# Patient Record
Sex: Female | Born: 1995 | Hispanic: Yes | Marital: Single | Smoking: Never smoker
Health system: Southern US, Community
[De-identification: ages and names within clinical notes are randomized; demographics above are authoritative.]

---

## 2015-08-31 ENCOUNTER — Emergency Department (HOSPITAL_COMMUNITY)
Admission: EM | Admit: 2015-08-31 | Discharge: 2015-08-31 | Disposition: A | Discharge status: Home / Self Care | Payer: Medicaid Other | Attending: Emergency Medicine | Admitting: Emergency Medicine

## 2015-08-31 ENCOUNTER — Encounter (HOSPITAL_COMMUNITY): Payer: Self-pay | Admitting: *Deleted

## 2015-08-31 DIAGNOSIS — J029 Acute pharyngitis, unspecified: Secondary | ICD-10-CM | POA: Diagnosis present

## 2015-08-31 LAB — COMPREHENSIVE METABOLIC PANEL
ALBUMIN: 4.3 g/dL (ref 3.5–5.0)
ALT: 21 U/L (ref 14–54)
AST: 19 U/L (ref 15–41)
Alkaline Phosphatase: 68 U/L (ref 38–126)
Anion gap: 6 (ref 5–15)
BILIRUBIN TOTAL: 0.5 mg/dL (ref 0.3–1.2)
BUN: 9 mg/dL (ref 6–20)
CO2: 27 mmol/L (ref 22–32)
Calcium: 9.8 mg/dL (ref 8.9–10.3)
Chloride: 107 mmol/L (ref 101–111)
Creatinine, Ser: 0.67 mg/dL (ref 0.44–1.00)
GFR calc Af Amer: >60 mL/min (ref >60)
GFR calc non Af Amer: >60 mL/min (ref >60)
GLUCOSE: 74 mg/dL (ref 65–99)
POTASSIUM: 3.8 mmol/L (ref 3.5–5.1)
SODIUM: 140 mmol/L (ref 135–145)
TOTAL PROTEIN: 7.1 g/dL (ref 6.5–8.1)

## 2015-08-31 LAB — MONONUCLEOSIS SCREEN: Mono Screen: NEGATIVE

## 2015-08-31 LAB — CBC WITH DIFFERENTIAL/PLATELET
BASOS ABS: 0 10*3/uL (ref 0.0–0.1)
BASOS PCT: 0 %
EOS ABS: 0.2 10*3/uL (ref 0.0–0.7)
Eosinophils Relative: 2 %
HEMATOCRIT: 36.5 % (ref 36.0–46.0)
HEMOGLOBIN: 12.3 g/dL (ref 12.0–15.0)
Lymphocytes Relative: 22 %
Lymphs Abs: 1.5 10*3/uL (ref 0.7–4.0)
MCH: 27.2 pg (ref 26.0–34.0)
MCHC: 33.7 g/dL (ref 30.0–36.0)
MCV: 80.8 fL (ref 78.0–100.0)
MONO ABS: 0.4 10*3/uL (ref 0.1–1.0)
Monocytes Relative: 6 %
NEUTROS ABS: 4.8 10*3/uL (ref 1.7–7.7)
NEUTROS PCT: 70 %
Platelets: 208 10*3/uL (ref 150–400)
RBC: 4.52 MIL/uL (ref 3.87–5.11)
RDW: 13.5 % (ref 11.5–15.5)
WBC: 6.9 10*3/uL (ref 4.0–10.5)

## 2015-08-31 LAB — I-STAT BETA HCG BLOOD, ED (MC, WL, AP ONLY): I-stat hCG, quantitative: <5 m[IU]/mL (ref <5)

## 2015-08-31 MED ORDER — GI COCKTAIL ~~LOC~~
30.0000 mL | Freq: Once | ORAL | Status: AC
  Administered 2015-08-31: 30 mL via ORAL
  Filled 2015-08-31: qty 30

## 2015-08-31 MED ORDER — SODIUM CHLORIDE 0.9 % IV BOLUS (SEPSIS)
1000.0000 mL | Freq: Once | INTRAVENOUS | Status: AC
  Administered 2015-08-31: 1000 mL via INTRAVENOUS

## 2015-08-31 MED ORDER — KETOROLAC TROMETHAMINE 30 MG/ML IJ SOLN
30.0000 mg | Freq: Once | INTRAMUSCULAR | Status: AC
  Administered 2015-08-31: 30 mg via INTRAVENOUS
  Filled 2015-08-31: qty 1

## 2015-08-31 MED ORDER — FLUTICASONE PROPIONATE 50 MCG/ACT NA SUSP: 2.0000 | Freq: Every day | NASAL | 0 refills | Status: DC

## 2015-08-31 MED ORDER — DEXAMETHASONE SODIUM PHOSPHATE 10 MG/ML IJ SOLN
4.0000 mg | Freq: Once | INTRAMUSCULAR | Status: AC
  Administered 2015-08-31: 4 mg via INTRAVENOUS
  Filled 2015-08-31: qty 1

## 2015-08-31 NOTE — ED Provider Notes (Signed)
[facility]-EMERGENCY DEPT Provider Note   CSN: [id number] Arrival date & time: 08/31/15  0800     History   Chief Complaint Chief Complaint  Patient presents with  . Sore Throat    HPI Leah Kim is a 20 y.o. female alert healthy here presenting with sore throat. Patient states that she's been having sore throat for the last 3 weeks. She has some trouble swallowing but is able to keep food and liquids down. Patient saw an allergist about 2 weeks ago and had a negative strep and was sent home with Augmentin and prednisone which did not help much. She then follow-up with the allergist about 5 days ago and had another negative strep swab and was given some mouth rinses that did not help. Denies any fevers or chills. Patient states that she was clearing her throat this morning and then spit up some sputum that is blood-tinged. Denies any cough or abdominal pain. She is otherwise healthy.    The history is provided by the patient.    History reviewed. No pertinent past medical history.  There are no active problems to display for this patient.   History reviewed. No pertinent surgical history.  OB History    No data available       Home Medications    Prior to Admission medications   Not on File    Family History No family history on file.  Social History Social History  Substance Use Topics  . Smoking status: Never Smoker  . Smokeless tobacco: Never Used  . Alcohol use No     Allergies   Review of patient's allergies indicates no known allergies.   Review of Systems Review of Systems  HENT: Positive for sore throat.   All other systems reviewed and are negative.    Physical Exam Updated Vital Signs BP 96/78 (BP Location: Left Arm)   Pulse 80   Temp 99 F (37.2 C) (Oral)   Resp 16   Ht 5' (1.524 m)   Wt 186 lb (84.4 kg)   LMP 08/04/2015   SpO2 100%   BMI 36.33 kg/m   Physical Exam  Constitutional: She is oriented to person, place, and time. She  appears well-developed and well-nourished.  Slightly anxious   HENT:  Head: Normocephalic.  OP not red, mildly irritated but no active bleeding. Tonsils not enlarged and no exudates   Eyes: EOM are normal. Pupils are equal, round, and reactive to light.  Neck: Normal range of motion.  Mild bilateral cervical LAD   Cardiovascular: Normal rate, regular rhythm and normal heart sounds.   Pulmonary/Chest: Effort normal and breath sounds normal. No respiratory distress. She has no wheezes. She has no rales.  Abdominal: Soft. Bowel sounds are normal. She exhibits no distension. There is no tenderness. There is no guarding.  Musculoskeletal: Normal range of motion. She exhibits no edema.  Neurological: She is alert and oriented to person, place, and time.  Skin: Skin is warm.  Psychiatric: She has a normal mood and affect.  Nursing note and vitals reviewed.    ED Treatments / Results  Labs (all labs ordered are listed, but only abnormal results are displayed) Labs Reviewed  CBC WITH DIFFERENTIAL/PLATELET  COMPREHENSIVE METABOLIC PANEL  MONONUCLEOSIS SCREEN  I-STAT BETA HCG BLOOD, ED ([facility], [facility], [facility] ONLY)    EKG  EKG Interpretation None       Radiology No results found.  Procedures Procedures (including critical care time)  Medications Ordered in ED Medications  sodium chloride 0.9 % bolus 1,000 mL (0 mLs Intravenous Stopped 08/31/15 1104)  ketorolac (TORADOL) 30 MG/ML injection 30 mg (30 mg Intravenous Given 08/31/15 1006)  gi cocktail (Maalox,Lidocaine,Donnatal) (30 mLs Oral Given 08/31/15 1000)  dexamethasone (DECADRON) injection 4 mg (4 mg Intravenous Given 08/31/15 1001)     Initial Impression / Assessment and Plan / ED Course  I have reviewed the triage vital signs and the nursing notes.  Pertinent labs & imaging results that were available during my care of the patient were reviewed by me and considered in my medical decision making (see chart for details).  Clinical  Course    Leah Kim is a 20 y.o. female here with sore throat. Sore throat for 3 weeks, low grade temp in the ED. Posterior pharynx mildly irritated but no tonsillar exudates. Had 2 neg strep already over the last 3 weeks. No tick bites. I think likely mono vs allergic vs viral. Will check labs, mono. Will hydrate and reassess.   11:27 AM Labs unremarkable. Mono spot neg. Felt better with GI cocktail and decadron. Will give flonase as it may help if its allergic or viral. She request ENT follow up outpatient.    Final Clinical Impressions(s) / ED Diagnoses   Final diagnoses:  None    New Prescriptions New Prescriptions   No medications on file     Leah Panderavid Hsienta Kristianne Albin, MD 08/31/15 1129

## 2015-08-31 NOTE — ED Notes (Signed)
Pt reports a feeling of something caught on the right side of her throat for 3 weeks. Pt reports increasing pain and blood in her saliva. Pt reports no relief and the specialist she is seeing does not have the necessary equipment to treat her further. Attempted antibiotics and a gargling solution with not relief.

## 2015-08-31 NOTE — Discharge Instructions (Signed)
Use flonase twice daily bilateral nose as it may have your throat.   Continue tylenol, motrin for fever and sore throat.   See Dr. Annalee GentaShoemaker from ENT for follow up   Return to ER if you have worse sore throat, fever, throat swelling, trouble breathing or swallowing.

## 2015-08-31 NOTE — ED Triage Notes (Signed)
Patient states she feels as though something is stuck in her throat x4 weeks and has had neck pain.  Patient saw a doctor [facility] almost 2 weeks ago and was started on Augmentin and prednisone.  She has finished both courses and had f/u with them this past Tuesday.  Patient was prescribed a mouth wash for gargling.  She states pain is not getting better and she now has blood streaked saliva occasionally.  Patient denies fever and N/V/D.  Strep was negative at allergist's office both visits.  Airway patent, oropharynx mildly erythematous, tonsils apparent, uvula midline.  No external swelling noted.

## 2015-11-14 ENCOUNTER — Other Ambulatory Visit (HOSPITAL_COMMUNITY): Payer: Self-pay | Admitting: Otolaryngology

## 2015-11-14 DIAGNOSIS — R1313 Dysphagia, pharyngeal phase: Secondary | ICD-10-CM

## 2015-11-14 DIAGNOSIS — K219 Gastro-esophageal reflux disease without esophagitis: Secondary | ICD-10-CM

## 2015-11-14 DIAGNOSIS — R0989 Other specified symptoms and signs involving the circulatory and respiratory systems: Secondary | ICD-10-CM

## 2015-11-20 ENCOUNTER — Encounter (HOSPITAL_COMMUNITY): Payer: Self-pay | Admitting: Radiology

## 2015-11-20 ENCOUNTER — Ambulatory Visit (HOSPITAL_COMMUNITY)
Admission: RE | Admit: 2015-11-20 | Discharge: 2015-11-20 | Disposition: A | Discharge status: Home / Self Care | Payer: Medicaid Other | Source: Ambulatory Visit | Attending: Otolaryngology | Admitting: Otolaryngology

## 2015-11-20 DIAGNOSIS — R1313 Dysphagia, pharyngeal phase: Secondary | ICD-10-CM

## 2015-11-20 DIAGNOSIS — K219 Gastro-esophageal reflux disease without esophagitis: Secondary | ICD-10-CM | POA: Insufficient documentation

## 2015-11-20 DIAGNOSIS — R0989 Other specified symptoms and signs involving the circulatory and respiratory systems: Secondary | ICD-10-CM

## 2015-11-21 ENCOUNTER — Ambulatory Visit (HOSPITAL_COMMUNITY): Payer: Medicaid Other

## 2016-05-06 ENCOUNTER — Emergency Department (HOSPITAL_COMMUNITY)
Admission: EM | Admit: 2016-05-06 | Discharge: 2016-05-06 | Disposition: A | Discharge status: Home / Self Care | Payer: Medicaid Other | Attending: Physician Assistant | Admitting: Physician Assistant

## 2016-05-06 ENCOUNTER — Emergency Department (HOSPITAL_COMMUNITY): Payer: Medicaid Other

## 2016-05-06 ENCOUNTER — Encounter (HOSPITAL_COMMUNITY): Payer: Self-pay | Admitting: *Deleted

## 2016-05-06 DIAGNOSIS — R072 Precordial pain: Secondary | ICD-10-CM | POA: Insufficient documentation

## 2016-05-06 DIAGNOSIS — R071 Chest pain on breathing: Secondary | ICD-10-CM | POA: Diagnosis present

## 2016-05-06 DIAGNOSIS — R0602 Shortness of breath: Secondary | ICD-10-CM | POA: Insufficient documentation

## 2016-05-06 LAB — BASIC METABOLIC PANEL
ANION GAP: 8 (ref 5–15)
BUN: 12 mg/dL (ref 6–20)
CALCIUM: 9.6 mg/dL (ref 8.9–10.3)
CO2: 26 mmol/L (ref 22–32)
CREATININE: 0.83 mg/dL (ref 0.44–1.00)
Chloride: 102 mmol/L (ref 101–111)
GFR calc Af Amer: >60 mL/min (ref >60)
GLUCOSE: 83 mg/dL (ref 65–99)
Potassium: 3.3 mmol/L — ABNORMAL LOW (ref 3.5–5.1)
Sodium: 136 mmol/L (ref 135–145)

## 2016-05-06 LAB — CBC
HCT: 35.7 % — ABNORMAL LOW (ref 36.0–46.0)
HEMOGLOBIN: 12.3 g/dL (ref 12.0–15.0)
MCH: 26.6 pg (ref 26.0–34.0)
MCHC: 34.5 g/dL (ref 30.0–36.0)
MCV: 77.1 fL — ABNORMAL LOW (ref 78.0–100.0)
PLATELETS: 230 10*3/uL (ref 150–400)
RBC: 4.63 MIL/uL (ref 3.87–5.11)
RDW: 13.7 % (ref 11.5–15.5)
WBC: 9.6 10*3/uL (ref 4.0–10.5)

## 2016-05-06 LAB — D-DIMER, QUANTITATIVE: D-Dimer, Quant: <0.27 ug/mL-FEU (ref 0.00–0.50)

## 2016-05-06 LAB — I-STAT TROPONIN, ED: TROPONIN I, POC: 0 ng/mL (ref 0.00–0.08)

## 2016-05-06 NOTE — ED Provider Notes (Signed)
[facility]-EMERGENCY DEPT Provider Note   CSN: [id number] Arrival date & time: 05/06/16  1831     History   Chief Complaint Chief Complaint  Patient presents with  . Chest Pain    HPI Leah Kim is a 21 y.o. female.  The history is provided by the patient.  Chest Pain   This is a new problem. The current episode started yesterday. The problem occurs constantly (intermittent). The problem has not changed since onset.The pain is associated with rest. The pain is present in the lateral region. The pain is at a severity of 3/10. The pain is mild. The quality of the pain is described as brief, heavy, exertional and pressure-like. The pain radiates to the left arm. The symptoms are aggravated by deep breathing. Associated symptoms include shortness of breath. Pertinent negatives include no abdominal pain, no fever, no palpitations and no syncope. She has tried nothing for the symptoms. The treatment provided no relief. Risk factors: recent car trip > 8 8hours, no estrogens.  Pertinent negatives for past medical history include no CAD and no PE.  Pertinent negatives for family medical history include: no early MI.    History reviewed. No pertinent past medical history.  There are no active problems to display for this patient.   History reviewed. No pertinent surgical history.  OB History    No data available       Home Medications    Prior to Admission medications   Medication Sig Start Date End Date Taking? Authorizing Provider  fluticasone (FLONASE) 50 MCG/ACT nasal spray Place 2 sprays into both nostrils daily. 08/31/15   Charlynne Pander, MD    Family History No family history on file.  Social History Social History  Substance Use Topics  . Smoking status: Never Smoker  . Smokeless tobacco: Never Used  . Alcohol use No     Allergies   Patient has no known allergies.   Review of Systems Review of Systems  Constitutional: Negative for fatigue and fever.    Respiratory: Positive for shortness of breath.   Cardiovascular: Positive for chest pain. Negative for palpitations and syncope.  Gastrointestinal: Negative for abdominal pain.     Physical Exam Updated Vital Signs BP 138/83 (BP Location: Left Wrist)   Pulse (!) 115   Temp 98 F (36.7 C) (Oral)   Resp (!) 22   Ht 5' (1.524 m)   Wt 182 lb (82.6 kg)   LMP 04/29/2016   SpO2 100%   BMI 35.54 kg/m   Physical Exam  Constitutional: She is oriented to person, place, and time. She appears well-developed and well-nourished.  HENT:  Head: Normocephalic and atraumatic.  Eyes: Right eye exhibits no discharge.  Cardiovascular: Normal rate, regular rhythm and normal heart sounds.   No murmur heard. Pulmonary/Chest: Effort normal and breath sounds normal. She has no wheezes. She has no rales.  Abdominal: Soft. She exhibits no distension. There is no tenderness.  Neurological: She is oriented to person, place, and time.  Skin: Skin is warm and dry. She is not diaphoretic.  Psychiatric: She has a normal mood and affect.  Nursing note and vitals reviewed.    ED Treatments / Results  Labs (all labs ordered are listed, but only abnormal results are displayed) Labs Reviewed  BASIC METABOLIC PANEL - Abnormal; Notable for the following:       Result Value   Potassium 3.3 (*)    All other components within normal limits  CBC - Abnormal; Notable  for the following:    HCT 35.7 (*)    MCV 77.1 (*)    All other components within normal limits  D-DIMER, QUANTITATIVE (NOT AT [facility])  I-STAT TROPOININ, ED    EKG  EKG Interpretation  Date/Time:  Wednesday May 06 2016 18:42:33 EDT Ventricular Rate:  114 PR Interval:    QRS Duration: 93 QT Interval:  333 QTC Calculation: 459 R Axis:   41 Text Interpretation:  Sinus tachycardia Normal sinus rhythm Confirmed by Kandis Mannan ([id number]) on 05/06/2016 7:43:54 PM       Radiology Dg Chest 2 View  Result Date: 05/06/2016 CLINICAL DATA:   Left-sided chest pain EXAM: CHEST  2 VIEW COMPARISON:  None. FINDINGS: The heart size and mediastinal contours are within normal limits. Both lungs are clear. The visualized skeletal structures are unremarkable. IMPRESSION: No active cardiopulmonary disease. Electronically Signed   By: Alcide Clever M.D.   On: 05/06/2016 19:31    Procedures Procedures (including critical care time)  Medications Ordered in ED Medications - No data to display   Initial Impression / Assessment and Plan / ED Course  I have reviewed the triage vital signs and the nursing notes.  Pertinent labs & imaging results that were available during my care of the patient were reviewed by me and considered in my medical decision making (see chart for details).    Pt is a 21 yo here with L sided shoulder/chest pain associated occasionally SOB. Intermittent since yseterday, wosre with breath. Recent long car trip. Will d iimer, chest xray labs.  EKG non ischemic.    Final Clinical Impressions(s) / ED Diagnoses   Final diagnoses:  None    New Prescriptions New Prescriptions   No medications on file     Lealand Elting Randall An, MD 05/06/16 1950

## 2016-05-06 NOTE — ED Notes (Signed)
Confirmed with lab that had the blood work to run D-Dimer.

## 2016-05-06 NOTE — Discharge Instructions (Signed)
We are unsure what is causing this pain in her chest. However we are reassured by her normal chest x-ray and lab work that shows you did not likely have a blood clot. Please return to primary care physician or the emergency department if this pain worsens, he developed acutely shortness of breath or have any other concerns.

## 2016-05-06 NOTE — ED Triage Notes (Signed)
Pt complains of left sided chest pain radiating down left arm since this morning. Pain is constant. Pt had similar pain last Friday, which she states went away after 4 hours. Pt states she started feeling short of breath 1 hour prior to arrival to ED. Pt denies cardiac history.

## 2016-11-25 ENCOUNTER — Encounter (HOSPITAL_COMMUNITY): Payer: Self-pay | Admitting: *Deleted

## 2016-11-25 ENCOUNTER — Emergency Department (HOSPITAL_COMMUNITY): Payer: Medicaid Other

## 2016-11-25 ENCOUNTER — Emergency Department (HOSPITAL_COMMUNITY)
Admission: EM | Admit: 2016-11-25 | Discharge: 2016-11-25 | Disposition: A | Discharge status: Home / Self Care | Payer: Medicaid Other | Attending: Emergency Medicine | Admitting: Emergency Medicine

## 2016-11-25 DIAGNOSIS — Z79899 Other long term (current) drug therapy: Secondary | ICD-10-CM | POA: Diagnosis not present

## 2016-11-25 DIAGNOSIS — R102 Pelvic and perineal pain: Secondary | ICD-10-CM | POA: Insufficient documentation

## 2016-11-25 DIAGNOSIS — Z3A00 Weeks of gestation of pregnancy not specified: Secondary | ICD-10-CM | POA: Insufficient documentation

## 2016-11-25 DIAGNOSIS — O200 Threatened abortion: Secondary | ICD-10-CM | POA: Diagnosis not present

## 2016-11-25 DIAGNOSIS — O209 Hemorrhage in early pregnancy, unspecified: Secondary | ICD-10-CM | POA: Diagnosis present

## 2016-11-25 LAB — WET PREP, GENITAL
Clue Cells Wet Prep HPF POC: NONE SEEN
Sperm: NONE SEEN
Trich, Wet Prep: NONE SEEN
Yeast Wet Prep HPF POC: NONE SEEN

## 2016-11-25 LAB — COMPREHENSIVE METABOLIC PANEL
ALT: 15 U/L (ref 14–54)
ANION GAP: 6 (ref 5–15)
AST: 22 U/L (ref 15–41)
Albumin: 4 g/dL (ref 3.5–5.0)
Alkaline Phosphatase: 60 U/L (ref 38–126)
BILIRUBIN TOTAL: 0.9 mg/dL (ref 0.3–1.2)
BUN: 9 mg/dL (ref 6–20)
CO2: 25 mmol/L (ref 22–32)
Calcium: 9 mg/dL (ref 8.9–10.3)
Chloride: 107 mmol/L (ref 101–111)
Creatinine, Ser: 0.65 mg/dL (ref 0.44–1.00)
Glucose, Bld: 84 mg/dL (ref 65–99)
POTASSIUM: 4 mmol/L (ref 3.5–5.1)
Sodium: 138 mmol/L (ref 135–145)
TOTAL PROTEIN: 7.3 g/dL (ref 6.5–8.1)

## 2016-11-25 LAB — URINALYSIS, ROUTINE W REFLEX MICROSCOPIC
BILIRUBIN URINE: NEGATIVE
Bacteria, UA: NONE SEEN
Glucose, UA: NEGATIVE mg/dL
KETONES UR: NEGATIVE mg/dL
LEUKOCYTES UA: NEGATIVE
NITRITE: NEGATIVE
PH: 5 (ref 5.0–8.0)
Protein, ur: NEGATIVE mg/dL
SPECIFIC GRAVITY, URINE: 1.012 (ref 1.005–1.030)

## 2016-11-25 LAB — CBC WITH DIFFERENTIAL/PLATELET
BASOS ABS: 0 10*3/uL (ref 0.0–0.1)
Basophils Relative: 0 %
EOS PCT: 2 %
Eosinophils Absolute: 0.2 10*3/uL (ref 0.0–0.7)
HEMATOCRIT: 34.2 % — AB (ref 36.0–46.0)
Hemoglobin: 11.6 g/dL — ABNORMAL LOW (ref 12.0–15.0)
LYMPHS PCT: 20 %
Lymphs Abs: 1.6 10*3/uL (ref 0.7–4.0)
MCH: 27 pg (ref 26.0–34.0)
MCHC: 33.9 g/dL (ref 30.0–36.0)
MCV: 79.5 fL (ref 78.0–100.0)
Monocytes Absolute: 0.3 10*3/uL (ref 0.1–1.0)
Monocytes Relative: 4 %
NEUTROS ABS: 6.2 10*3/uL (ref 1.7–7.7)
Neutrophils Relative %: 74 %
PLATELETS: 203 10*3/uL (ref 150–400)
RBC: 4.3 MIL/uL (ref 3.87–5.11)
RDW: 14.2 % (ref 11.5–15.5)
WBC: 8.3 10*3/uL (ref 4.0–10.5)

## 2016-11-25 LAB — I-STAT BETA HCG BLOOD, ED (MC, WL, AP ONLY): I-stat hCG, quantitative: 886.8 m[IU]/mL — ABNORMAL HIGH (ref <5)

## 2016-11-25 MED ORDER — HYDROCODONE-ACETAMINOPHEN 5-325 MG PO TABS: 1.0000 | ORAL_TABLET | ORAL | 0 refills | Status: AC | PRN

## 2016-11-25 MED ORDER — SODIUM CHLORIDE 0.9 % IV BOLUS (SEPSIS)
1000.0000 mL | Freq: Once | INTRAVENOUS | Status: AC
  Administered 2016-11-25: 1000 mL via INTRAVENOUS

## 2016-11-25 MED ORDER — KETOROLAC TROMETHAMINE 30 MG/ML IJ SOLN
30.0000 mg | Freq: Once | INTRAMUSCULAR | Status: AC
  Administered 2016-11-25: 30 mg via INTRAVENOUS
  Filled 2016-11-25: qty 1

## 2016-11-25 NOTE — ED Provider Notes (Signed)
[facility]-EMERGENCY DEPT Provider Note   CSN: [id number] Arrival date & time: 11/25/16  0757     History   Chief Complaint Chief Complaint  Patient presents with  . Vaginal Bleeding  . Abdominal Cramping  . Back Pain    HPI Leah Kim is a 21 y.o. female.  Pt presents to the ED today with abdominal pain and vaginal bleeding.  Pt's last period was on 10/7.  She is sexually active without birth control.  She does not usually have regular periods, so she was not sure what was going on this morning.  She said the flow was heavy this morning (1 pad), but the bleeding has slowed down.  Pt denies any f/c.  No n/v.      History reviewed. No pertinent past medical history.  There are no active problems to display for this patient.   History reviewed. No pertinent surgical history.  OB History    No data available       Home Medications    Prior to Admission medications   Medication Sig Start Date End Date Taking? Authorizing Provider  aspirin 325 MG tablet Take 650 mg every 4 (four) hours as needed by mouth for mild pain, moderate pain, fever or headache.   Yes [provider]  Multiple Vitamin (MULTIVITAMIN WITH MINERALS) TABS tablet Take 1 tablet daily by mouth.   Yes [provider]  phentermine (ADIPEX-P) 37.5 MG tablet Take 37.5 mg daily by mouth.   Yes [provider]  HYDROcodone-acetaminophen (NORCO/VICODIN) 5-325 MG tablet Take 1 tablet every 4 (four) hours as needed by mouth. 11/25/16   Jacalyn LefevreHaviland, Olin Gurski, MD    Family History No family history on file.  Social History Social History   Tobacco Use  . Smoking status: Never Smoker  . Smokeless tobacco: Never Used  Substance Use Topics  . Alcohol use: No  . Drug use: No     Allergies   Patient has no known allergies.   Review of Systems Review of Systems  Gastrointestinal: Positive for abdominal pain.  Genitourinary: Positive for vaginal bleeding.   All other systems reviewed and are negative.    Physical Exam Updated Vital Signs BP 119/61 (BP Location: Left Arm)   Pulse 92   Temp 98 F (36.7 C) (Oral)   Resp 14   LMP 10/18/2016   SpO2 100%   Physical Exam  Constitutional: She is oriented to person, place, and time. She appears well-developed and well-nourished.  HENT:  Head: Normocephalic and atraumatic.  Right Ear: External ear normal.  Left Ear: External ear normal.  Nose: Nose normal.  Mouth/Throat: Oropharynx is clear and moist.  Eyes: Conjunctivae and EOM are normal. Pupils are equal, round, and reactive to light.  Neck: Normal range of motion. Neck supple.  Cardiovascular: Normal rate, regular rhythm, normal heart sounds and intact distal pulses.  Pulmonary/Chest: Effort normal and breath sounds normal.  Abdominal: Soft. Bowel sounds are normal. There is tenderness in the suprapubic area.  Genitourinary: Uterus is tender. Right adnexum displays no mass and no tenderness. Left adnexum displays no mass and no tenderness. There is bleeding in the vagina.  Musculoskeletal: Normal range of motion.  Neurological: She is alert and oriented to person, place, and time.  Skin: Skin is warm.  Psychiatric: She has a normal mood and affect. Her behavior is normal. Judgment and thought content normal.  Nursing note and vitals reviewed.    ED Treatments / Results  Labs (all  labs ordered are listed, but only abnormal results are displayed) Labs Reviewed  WET PREP, GENITAL - Abnormal; Notable for the following components:      Result Value   WBC, Wet Prep HPF POC MODERATE (*)    All other components within normal limits  CBC WITH DIFFERENTIAL/PLATELET - Abnormal; Notable for the following components:   Hemoglobin 11.6 (*)    HCT 34.2 (*)    All other components within normal limits  URINALYSIS, ROUTINE W REFLEX MICROSCOPIC - Abnormal; Notable for the following components:   Hgb urine dipstick LARGE (*)    Squamous  Epithelial / LPF 0-5 (*)    All other components within normal limits  I-STAT BETA HCG BLOOD, ED ([facility], [facility], [facility] ONLY) - Abnormal; Notable for the following components:   I-stat hCG, quantitative 886.8 (*)    All other components within normal limits  COMPREHENSIVE METABOLIC PANEL  GC/CHLAMYDIA PROBE AMP ([place]) NOT AT [facility]    EKG  EKG Interpretation None       Radiology [place] Ob Comp < 14 Wks  Result Date: 11/25/2016 CLINICAL DATA:  Vaginal bleeding and cramping. EXAM: OBSTETRIC <14 WK US AND TRANSVAGINAL OB US TECHNIQUE: Both transabdominal and transvaginal ultrasound examinations were performed for complete evaluation of the gestation as well as the maternal uterus, adnexal regions, and pelvic cul-de-sac. Transvaginal technique was performed to assess early pregnancy. COMPARISON:  None. FINDINGS: Intrauterine gestational sac: None visualized Maternal uterus/adnexae: Unremarkable appearance of the uterus and right ovary. Cystic structure with thick wall and increased peripheral vascularity in the left ovary measures 1.5 cm, likely a corpus luteum. No free fluid. IMPRESSION: No intrauterine or definite ectopic pregnancy identified. Likely corpus luteum in the left ovary. Electronically Signed   By: Sebastian AcheAllen  Grady M.D.   On: 11/25/2016 11:06   [place] Ob Transvaginal  Result Date: 11/25/2016 CLINICAL DATA:  Vaginal bleeding and cramping. EXAM: OBSTETRIC <14 WK US AND TRANSVAGINAL OB US TECHNIQUE: Both transabdominal and transvaginal ultrasound examinations were performed for complete evaluation of the gestation as well as the maternal uterus, adnexal regions, and pelvic cul-de-sac. Transvaginal technique was performed to assess early pregnancy. COMPARISON:  None. FINDINGS: Intrauterine gestational sac: None visualized Maternal uterus/adnexae: Unremarkable appearance of the uterus and right ovary. Cystic structure with thick wall and increased peripheral vascularity in the left ovary measures 1.5 cm,  likely a corpus luteum. No free fluid. IMPRESSION: No intrauterine or definite ectopic pregnancy identified. Likely corpus luteum in the left ovary. Electronically Signed   By: Sebastian AcheAllen  Grady M.D.   On: 11/25/2016 11:06    Procedures Procedures (including critical care time)  Medications Ordered in ED Medications  sodium chloride 0.9 % bolus 1,000 mL (1,000 mLs Intravenous New Bag/Given 11/25/16 0843)  ketorolac (TORADOL) 30 MG/ML injection 30 mg (30 mg Intravenous Given 11/25/16 0843)     Initial Impression / Assessment and Plan / ED Course  I have reviewed the triage vital signs and the nursing notes.  Pertinent labs & imaging results that were available during my care of the patient were reviewed by me and considered in my medical decision making (see chart for details).    No evidence of ectopic on US.  No adnexal tenderness on exam.  Suspect miscarriage.  Pt is instructed [facility] f[facility] with [facility].  Return for worsening of abd pain, bleeding, or dizziness.  Final Clinical Impressions(s) / ED Diagnoses   Final diagnoses:  Threatened miscarriage in early pregnancy    ED Discharge Orders  Ordered    HYDROcodone-acetaminophen (NORCO/VICODIN) 5-325 MG tablet  Every 4 hours PRN     11/25/16 1123       Jacalyn Lefevre, MD 11/25/16 1124

## 2016-11-25 NOTE — ED Triage Notes (Signed)
Pt complains of lower abdominal cramping, back pain and vaginal bleeding since this morning. Pt's last menstrual period was 10/7. Pt states she is sexually active and is not on birth control. Pt states she filled 1 pad this morning.

## 2016-11-26 LAB — GC/CHLAMYDIA PROBE AMP (~~LOC~~) NOT AT ARMC
Chlamydia: NEGATIVE
Neisseria Gonorrhea: NEGATIVE

## 2019-05-05 IMAGING — US US OB TRANSVAGINAL
1 series · 14 of 28 positions shown · non-contrast
Comparison: None.

CLINICAL DATA: Vaginal bleeding and cramping.

EXAM:
OBSTETRIC <14 WK US AND TRANSVAGINAL OB US
TECHNIQUE: Both transabdominal and transvaginal ultrasound examinations were
performed for complete evaluation of the gestation as well as the
maternal uterus, adnexal regions, and pelvic cul-de-sac.
Transvaginal technique was performed to assess early pregnancy.

[Series 1: us ob transvaginal · 0.23mm/px · 14 of 87 slices shown]
[im 4/87]
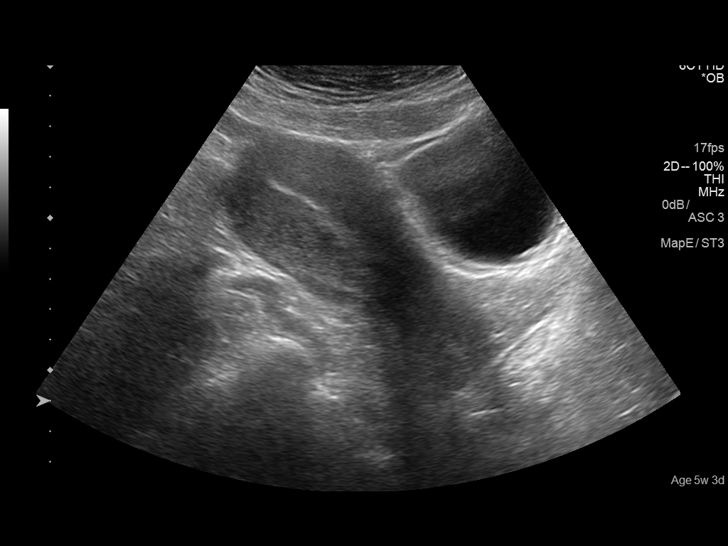
[im 10/87]
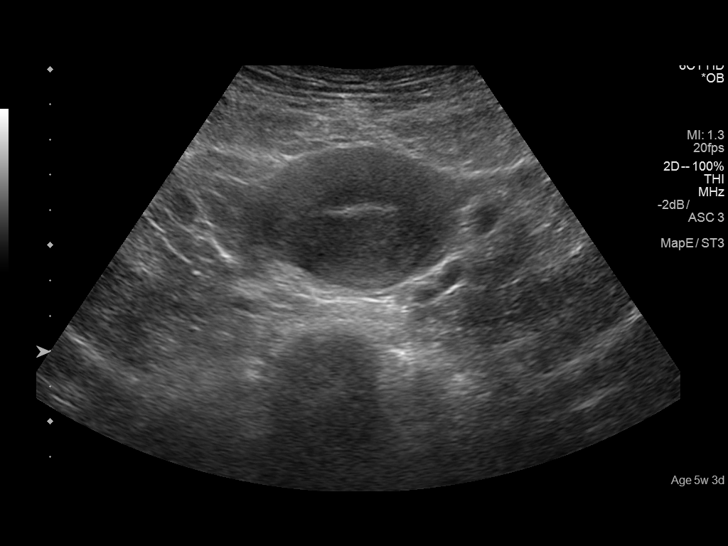
[im 16/87]
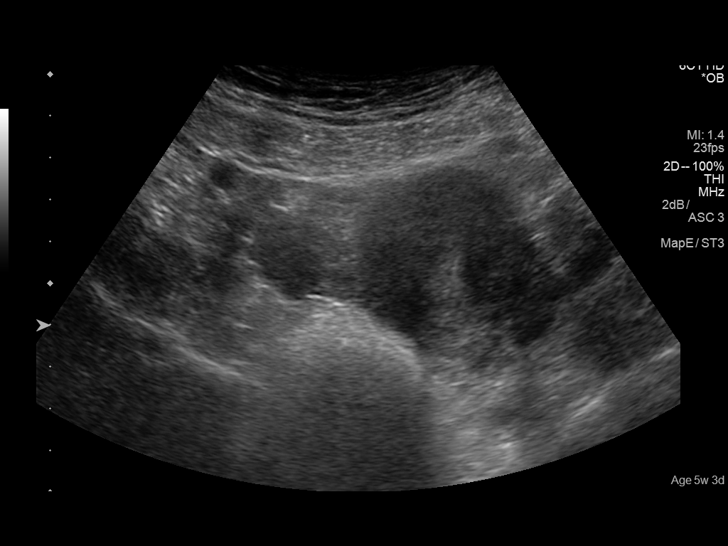
[im 23/87]
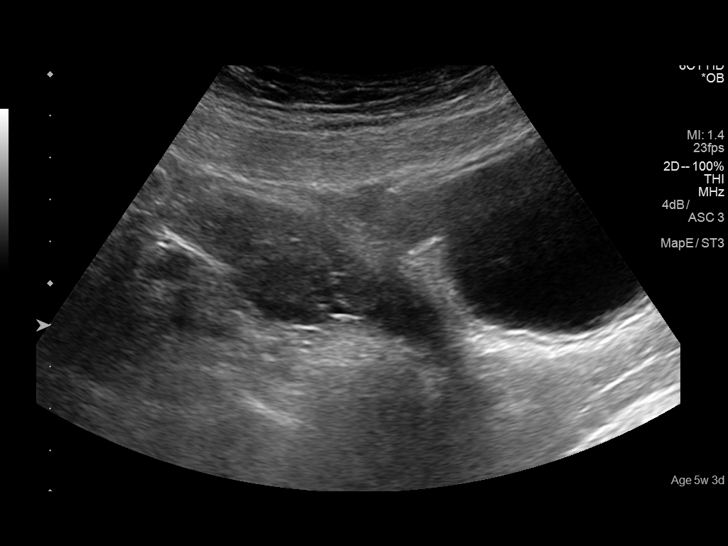
[im 29/87]
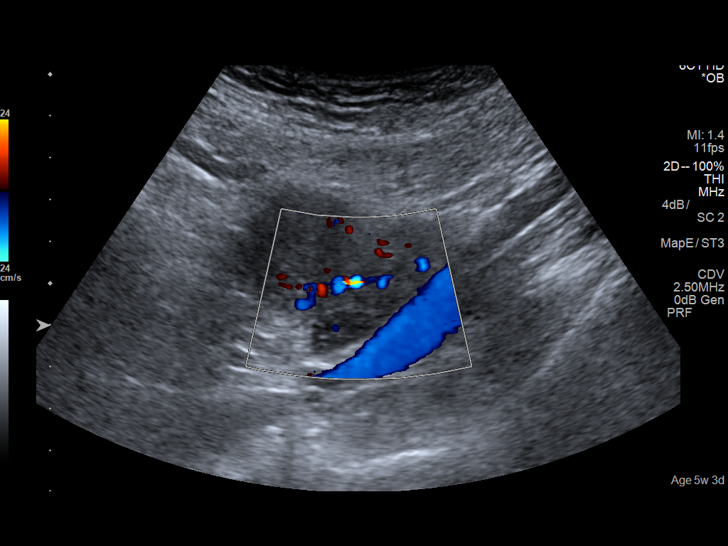
[im 36/87]
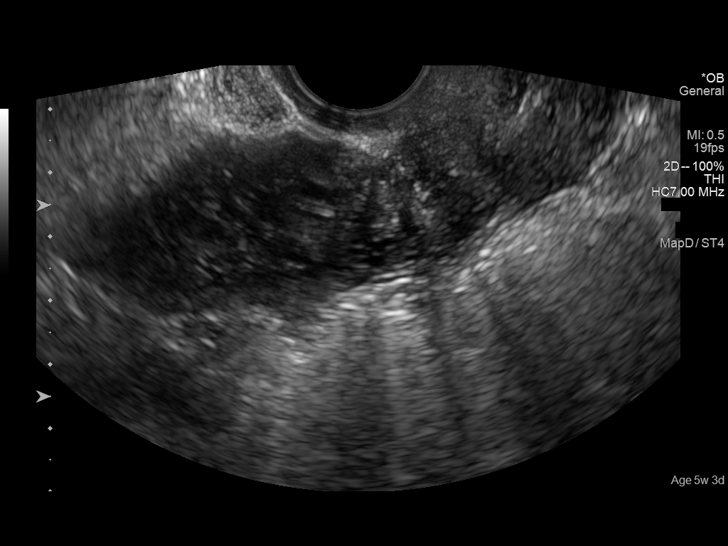
[im 42/87]
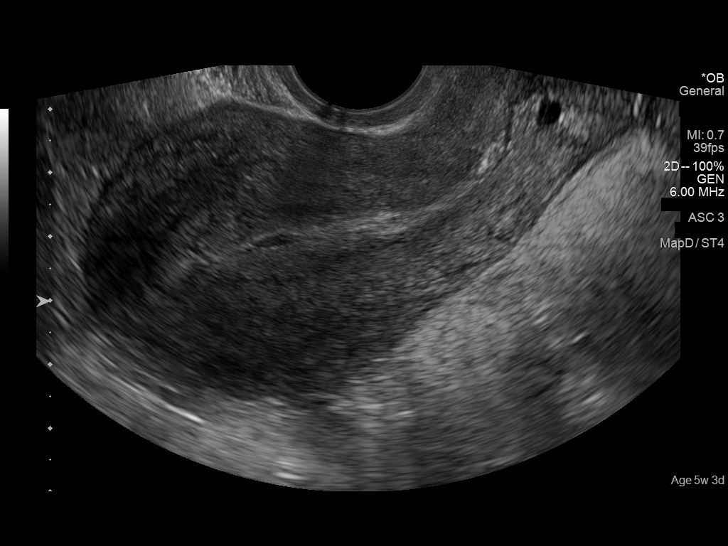
[im 48/87]
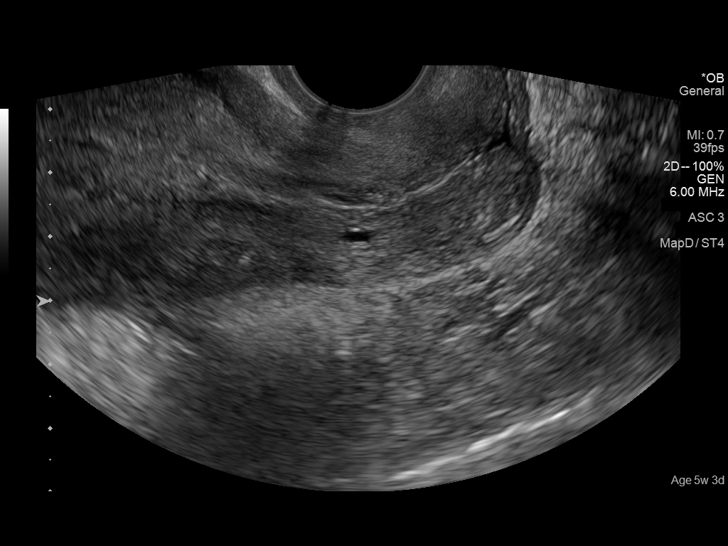
[im 55/87]
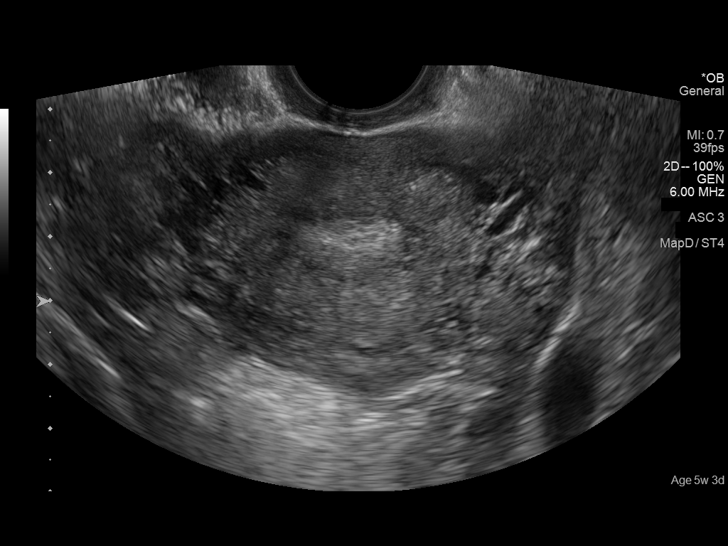
[im 61/87]
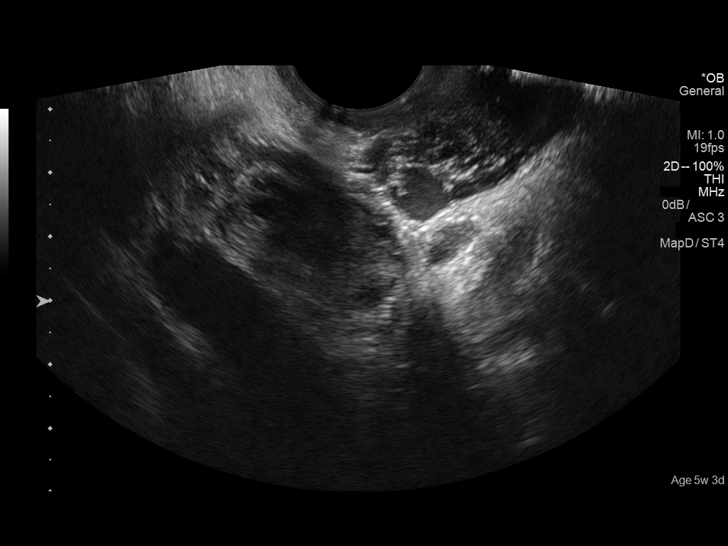
[im 67/87]
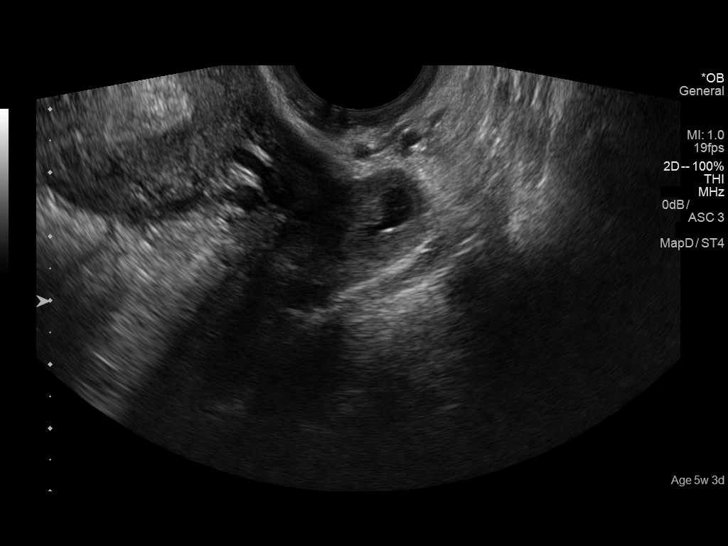
[im 74/87]
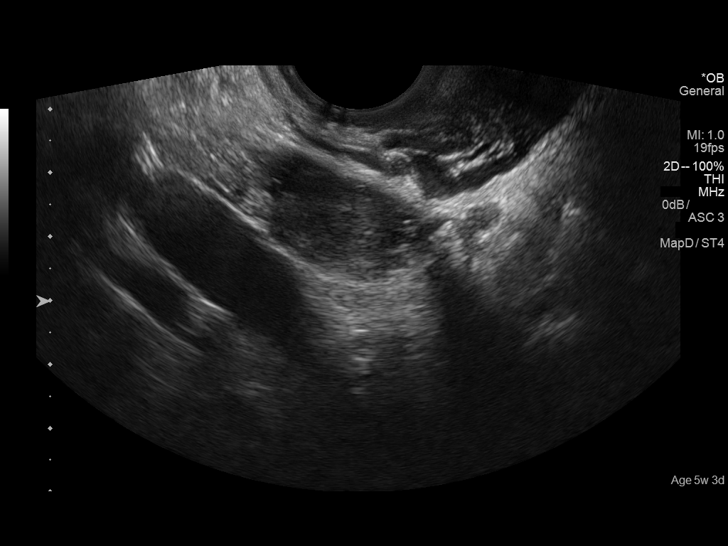
[im 80/87]
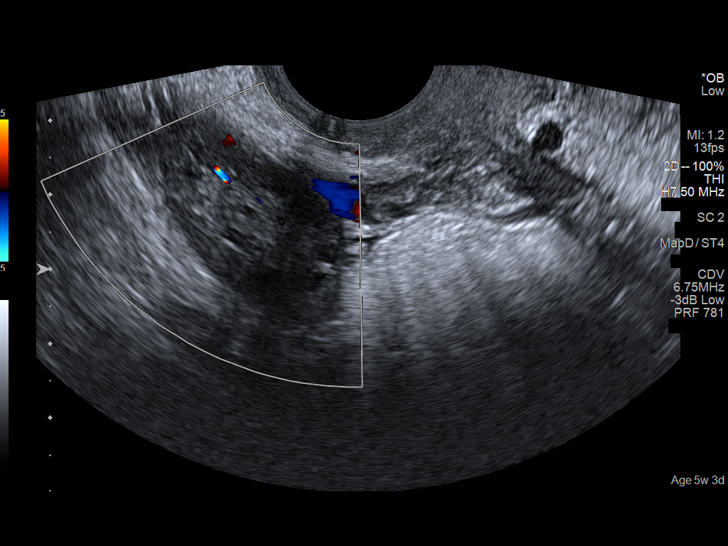
[im 87/87]
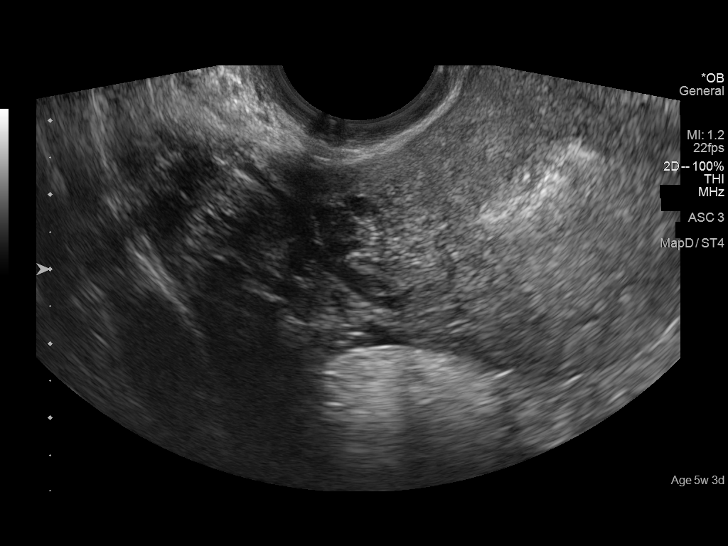

[14 of 28 positions shown; findings below may reference images not displayed]

FINDINGS: Intrauterine gestational sac: None visualized

Maternal uterus/adnexae: Unremarkable appearance of the uterus and
right ovary. Cystic structure with thick wall and increased
peripheral vascularity in the left ovary measures 1.5 cm, likely a
corpus luteum. No free fluid.
IMPRESSION: No intrauterine or definite ectopic pregnancy identified. Likely
corpus luteum in the left ovary.
